# Patient Record
Sex: Female | Born: 1980 | Hispanic: Yes | Smoking: Never smoker
Health system: Southern US, Community
[De-identification: ages and names within clinical notes are randomized; demographics above are authoritative.]

## PROBLEM LIST (undated history)

## (undated) HISTORY — PX: TUBAL LIGATION: SHX77

---

## 2019-10-12 ENCOUNTER — Emergency Department
Admission: EM | Admit: 2019-10-12 | Discharge: 2019-10-13 | Disposition: A | Discharge status: Home / Self Care | Payer: Medicaid - Out of State | Attending: Emergency Medicine | Admitting: Emergency Medicine

## 2019-10-12 ENCOUNTER — Emergency Department: Payer: Medicaid - Out of State

## 2019-10-12 ENCOUNTER — Other Ambulatory Visit: Payer: Self-pay

## 2019-10-12 ENCOUNTER — Encounter: Payer: Self-pay | Admitting: Emergency Medicine

## 2019-10-12 DIAGNOSIS — R300 Dysuria: Secondary | ICD-10-CM | POA: Diagnosis not present

## 2019-10-12 DIAGNOSIS — R1031 Right lower quadrant pain: Secondary | ICD-10-CM | POA: Diagnosis present

## 2019-10-12 DIAGNOSIS — N390 Urinary tract infection, site not specified: Secondary | ICD-10-CM | POA: Insufficient documentation

## 2019-10-12 DIAGNOSIS — R109 Unspecified abdominal pain: Secondary | ICD-10-CM

## 2019-10-12 LAB — COMPREHENSIVE METABOLIC PANEL
ALT: 45 U/L — ABNORMAL HIGH (ref 0–44)
AST: 26 U/L (ref 15–41)
Albumin: 4 g/dL (ref 3.5–5.0)
Alkaline Phosphatase: 67 U/L (ref 38–126)
Anion gap: 8 (ref 5–15)
BUN: 8 mg/dL (ref 6–20)
CO2: 24 mmol/L (ref 22–32)
Calcium: 8.8 mg/dL — ABNORMAL LOW (ref 8.9–10.3)
Chloride: 104 mmol/L (ref 98–111)
Creatinine, Ser: 0.62 mg/dL (ref 0.44–1.00)
GFR calc Af Amer: >60 mL/min (ref >60)
GFR calc non Af Amer: >60 mL/min (ref >60)
Glucose, Bld: 132 mg/dL — ABNORMAL HIGH (ref 70–99)
Potassium: 3.7 mmol/L (ref 3.5–5.1)
Sodium: 136 mmol/L (ref 135–145)
Total Bilirubin: 0.7 mg/dL (ref 0.3–1.2)
Total Protein: 7.5 g/dL (ref 6.5–8.1)

## 2019-10-12 LAB — URINALYSIS, COMPLETE (UACMP) WITH MICROSCOPIC
Bacteria, UA: NONE SEEN
Bilirubin Urine: NEGATIVE
Glucose, UA: NEGATIVE mg/dL
Ketones, ur: NEGATIVE mg/dL
Nitrite: NEGATIVE
Protein, ur: 100 mg/dL — AB
RBC / HPF: >50 RBC/hpf — ABNORMAL HIGH (ref 0–5)
Specific Gravity, Urine: 1.006 (ref 1.005–1.030)
WBC, UA: >50 WBC/hpf — ABNORMAL HIGH (ref 0–5)
pH: 6 (ref 5.0–8.0)

## 2019-10-12 LAB — CBC WITH DIFFERENTIAL/PLATELET
Abs Immature Granulocytes: 0.02 10*3/uL (ref 0.00–0.07)
Basophils Absolute: 0 10*3/uL (ref 0.0–0.1)
Basophils Relative: 0 %
Eosinophils Absolute: 0 10*3/uL (ref 0.0–0.5)
Eosinophils Relative: 0 %
HCT: 36.5 % (ref 36.0–46.0)
Hemoglobin: 11.9 g/dL — ABNORMAL LOW (ref 12.0–15.0)
Immature Granulocytes: 0 %
Lymphocytes Relative: 24 %
Lymphs Abs: 1.8 10*3/uL (ref 0.7–4.0)
MCH: 26.4 pg (ref 26.0–34.0)
MCHC: 32.6 g/dL (ref 30.0–36.0)
MCV: 81.1 fL (ref 80.0–100.0)
Monocytes Absolute: 0.5 10*3/uL (ref 0.1–1.0)
Monocytes Relative: 6 %
Neutro Abs: 5.1 10*3/uL (ref 1.7–7.7)
Neutrophils Relative %: 70 %
Platelets: 219 10*3/uL (ref 150–400)
RBC: 4.5 MIL/uL (ref 3.87–5.11)
RDW: 14 % (ref 11.5–15.5)
WBC: 7.5 10*3/uL (ref 4.0–10.5)
nRBC: 0 % (ref 0.0–0.2)

## 2019-10-12 LAB — LIPASE, BLOOD: Lipase: 19 U/L (ref 11–51)

## 2019-10-12 LAB — POCT PREGNANCY, URINE: Preg Test, Ur: NEGATIVE

## 2019-10-12 MED ORDER — PHENAZOPYRIDINE HCL 200 MG PO TABS
200.0000 mg | ORAL_TABLET | Freq: Three times a day (TID) | ORAL | 0 refills | Status: AC | PRN
Start: 2019-10-12 — End: ?

## 2019-10-12 MED ORDER — CEPHALEXIN 500 MG PO CAPS
500.0000 mg | ORAL_CAPSULE | Freq: Once | ORAL | Status: AC
  Administered 2019-10-12: 500 mg via ORAL
  Filled 2019-10-12: qty 1

## 2019-10-12 MED ORDER — CEPHALEXIN 500 MG PO CAPS: 500.0000 mg | ORAL_CAPSULE | Freq: Three times a day (TID) | ORAL | 0 refills | Status: AC

## 2019-10-12 MED ORDER — KETOROLAC TROMETHAMINE 60 MG/2ML IM SOLN
30.0000 mg | Freq: Once | INTRAMUSCULAR | Status: AC
  Administered 2019-10-12: 30 mg via INTRAMUSCULAR
  Filled 2019-10-12: qty 2

## 2019-10-12 MED ORDER — PHENAZOPYRIDINE HCL 200 MG PO TABS
200.0000 mg | ORAL_TABLET | Freq: Once | ORAL | Status: AC
  Administered 2019-10-12: 200 mg via ORAL
  Filled 2019-10-12: qty 1

## 2019-10-12 MED ORDER — IBUPROFEN 800 MG PO TABS
800.0000 mg | ORAL_TABLET | Freq: Three times a day (TID) | ORAL | 0 refills | Status: AC | PRN
Start: 2019-10-12 — End: ?

## 2019-10-12 MED ORDER — HYDROCODONE-ACETAMINOPHEN 5-325 MG PO TABS
1.0000 | ORAL_TABLET | Freq: Once | ORAL | Status: AC
  Administered 2019-10-12: 1 via ORAL
  Filled 2019-10-12: qty 1

## 2019-10-12 MED ORDER — HYDROCODONE-ACETAMINOPHEN 5-325 MG PO TABS: 1.0000 | ORAL_TABLET | Freq: Four times a day (QID) | ORAL | 0 refills | Status: AC | PRN

## 2019-10-12 NOTE — ED Notes (Signed)
Pt to CT and has returned at this time.

## 2019-10-12 NOTE — ED Provider Notes (Signed)
[facility] Emergency Department Provider Note   ____________________________________________   First MD Initiated Contact with Patient 10/12/19 2308     (approximate)  I have reviewed the triage vital signs and the nursing notes.   HISTORY  Chief Complaint Abdominal Pain  History obtained via tele-Spanish interpreter  HPI Kristine Patterson is a 39 y.o. female who presents to the ED from home with a chief complaint of right flank/lower quadrant pain.  Onset yesterday morning.  Associated dysuria.  Denies fever, chills, cough, chest pain, shortness of breath, nausea, vomiting, diarrhea.  Denies recent travel or trauma.       Past medical history None  There are no problems to display for this patient.   Past Surgical History:  Procedure Laterality Date  . TUBAL LIGATION      Prior to Admission medications   Medication Sig Start Date End Date Taking? Authorizing Provider  cephALEXin (KEFLEX) 500 MG capsule Take 1 capsule (500 mg total) by mouth 3 (three) times daily. 10/12/19   Irean Hong, MD  HYDROcodone-acetaminophen (NORCO) 5-325 MG tablet Take 1 tablet by mouth every 6 (six) hours as needed for moderate pain. 10/12/19   Irean Hong, MD  ibuprofen (ADVIL) 800 MG tablet Take 1 tablet (800 mg total) by mouth every 8 (eight) hours as needed for moderate pain. 10/12/19   Irean Hong, MD  phenazopyridine (PYRIDIUM) 200 MG tablet Take 1 tablet (200 mg total) by mouth 3 (three) times daily as needed for pain. 10/12/19   Irean Hong, MD    Allergies Patient has no known allergies.  No family history on file.  Social History Social History   Tobacco Use  . Smoking status: Not on file  Substance Use Topics  . Alcohol use: Not on file  . Drug use: Not on file    Review of Systems  Constitutional: No fever/chills Eyes: No visual changes. ENT: No sore throat. Cardiovascular: Denies chest pain. Respiratory: Denies shortness of  breath. Gastrointestinal: Positive for right flank/lower quadrant abdominal pain.  No nausea, no vomiting.  No diarrhea.  No constipation. Genitourinary: Positive for dysuria. Musculoskeletal: Negative for back pain. Skin: Negative for rash. Neurological: Negative for headaches, focal weakness or numbness.   ____________________________________________   PHYSICAL EXAM:  VITAL SIGNS: ED Triage Vitals  Enc Vitals Group     BP 10/12/19 1927 129/75     Pulse Rate 10/12/19 1927 95     Resp 10/12/19 1927 18     Temp 10/12/19 1927 98.3 F (36.8 C)     Temp Source 10/12/19 1927 Oral     SpO2 10/12/19 1927 98 %     Weight 10/12/19 1926 (!) 310 lb 10.1 oz (140.9 kg)     Height --      Head Circumference --      Peak Flow --      Pain Score 10/12/19 1925 8     Pain Loc --      Pain Edu? --      Excl. in GC? --     Constitutional: Alert and oriented. Well appearing and in no acute distress. Eyes: Conjunctivae are normal. PERRL. EOMI. Head: Atraumatic. Nose: No congestion/rhinnorhea. Mouth/Throat: Mucous membranes are moist.  Oropharynx non-erythematous. Neck: No stridor.   Cardiovascular: Normal rate, regular rhythm. Grossly normal heart sounds.  Good peripheral circulation. Respiratory: Normal respiratory effort.  No retractions. Lungs CTAB. Gastrointestinal: Soft and nontender to light or deep palpation. No distention.  No abdominal bruits. No CVA tenderness. Musculoskeletal: No lower extremity tenderness nor edema.  No joint effusions. Neurologic:  Normal speech and language. No gross focal neurologic deficits are appreciated. No gait instability. Skin:  Skin is warm, dry and intact. No rash noted.  No vesicles. Psychiatric: Mood and affect are normal. Speech and behavior are normal.  ____________________________________________   LABS (all labs ordered are listed, but only abnormal results are displayed)  Labs Reviewed  CBC WITH DIFFERENTIAL/PLATELET - Abnormal; Notable  for the following components:      Result Value   Hemoglobin 11.9 (*)    All other components within normal limits  COMPREHENSIVE METABOLIC PANEL - Abnormal; Notable for the following components:   Glucose, Bld 132 (*)    Calcium 8.8 (*)    ALT 45 (*)    All other components within normal limits  URINALYSIS, COMPLETE (UACMP) WITH MICROSCOPIC - Abnormal; Notable for the following components:   Color, Urine YELLOW (*)    APPearance CLOUDY (*)    Hgb urine dipstick LARGE (*)    Protein, ur 100 (*)    Leukocytes,Ua LARGE (*)    RBC / HPF >50 (*)    WBC, UA >50 (*)    All other components within normal limits  LIPASE, BLOOD  POCT PREGNANCY, URINE   ____________________________________________  EKG  None ____________________________________________  RADIOLOGY  ED MD interpretation: Unremarkable CT  Official radiology report(s): CT Renal Stone Study  Result Date: 10/12/2019 CLINICAL DATA:  Lower abdominal pain radiating to right flank EXAM: CT ABDOMEN AND PELVIS WITHOUT CONTRAST TECHNIQUE: Multidetector CT imaging of the abdomen and pelvis was performed following the standard protocol without IV contrast. COMPARISON:  None. FINDINGS: Lower chest: No acute pleural or parenchymal lung disease. Hepatobiliary: No focal liver abnormality is seen. No gallstones, gallbladder wall thickening, or biliary dilatation. Pancreas: Unremarkable. No pancreatic ductal dilatation or surrounding inflammatory changes. Spleen: Normal in size without focal abnormality. Adrenals/Urinary Tract: No urinary tract calculi or obstructive uropathy. Bladder is unremarkable. The adrenals are normal. Stomach/Bowel: No bowel obstruction or ileus. Normal appendix right lower quadrant. No bowel wall thickening or inflammatory change. Vascular/Lymphatic: No significant vascular findings are present. No enlarged abdominal or pelvic lymph nodes. Reproductive: Uterus and bilateral adnexa are unremarkable. Other: No abdominal  wall hernia or abnormality. No abdominopelvic ascites. Musculoskeletal: No acute or destructive bony lesions. Reconstructed images demonstrate no additional findings. IMPRESSION: 1. No urinary tract calculi or obstructive uropathy. 2. Normal appendix. Electronically Signed   By: Sharlet Salina M.D.   On: 10/12/2019 23:14    ____________________________________________   PROCEDURES  Procedure(s) performed (including Critical Care):  Procedures   ____________________________________________   INITIAL IMPRESSION / ASSESSMENT AND PLAN / ED COURSE  As part of my medical decision making, I reviewed the following data within the electronic MEDICAL RECORD NUMBER Nursing notes reviewed and incorporated, Labs reviewed, Radiograph reviewed, Notes from prior ED visits and [place] Controlled Substance Database     [facility] Temesha Queener was evaluated in Emergency Department on 10/12/2019 for the symptoms described in the history of present illness. She was evaluated in the context of the global COVID-19 pandemic, which necessitated consideration that the patient might be at risk for infection with the SARS-CoV-2 virus that causes COVID-19. Institutional protocols and algorithms that pertain to the evaluation of patients at risk for COVID-19 are in a state of rapid change based on information released by regulatory bodies including the CDC and federal and state organizations. These policies and  algorithms were followed during the patient's care in the ED.    39 year old female presenting with right flank/lower quadrant abdominal pain and dysuria. Differential diagnosis includes, but is not limited to, ovarian cyst, ovarian torsion, acute appendicitis, diverticulitis, urinary tract infection/pyelonephritis, endometriosis, bowel obstruction, colitis, renal colic, gastroenteritis, hernia, fibroids, endometriosis, pregnancy related pain including ectopic pregnancy, etc.  Laboratory results unremarkable; UA positive  for leukocytes and WBC, CT renal colic study unremarkable.  Will administer IM Toradol and Norco for pain, start Keflex for antibiotic, Pyridium for urinary discomfort.  Strict return precautions given.  Patient verbalizes understanding agrees with plan of care.      ____________________________________________   FINAL CLINICAL IMPRESSION(S) / ED DIAGNOSES  Final diagnoses:  Lower urinary tract infectious disease  Flank pain  Dysuria  Right lower quadrant abdominal pain     ED Discharge Orders         Ordered    cephALEXin (KEFLEX) 500 MG capsule  3 times daily     10/12/19 2329    ibuprofen (ADVIL) 800 MG tablet  Every 8 hours PRN     10/12/19 2329    HYDROcodone-acetaminophen (NORCO) 5-325 MG tablet  Every 6 hours PRN     10/12/19 2329    phenazopyridine (PYRIDIUM) 200 MG tablet  3 times daily PRN     10/12/19 2329           Note:  This document was prepared using Dragon voice recognition software and may include unintentional dictation errors.   Paulette Blanch, MD 10/13/19 [phone]

## 2019-10-12 NOTE — ED Triage Notes (Signed)
Patient ambulatory to triage with steady gait, without difficulty or distress noted, mask in place; pt reports having lower abd pain radiating around into rt flank since yesterday with no accomp symptoms; denies hx of same

## 2019-10-12 NOTE — Discharge Instructions (Addendum)
1.  Take antibiotic as prescribed (Keflex 500 mg 3 times daily x7 days). 2.  Take Pyridium for urinary discomfort. 3.  You may take pain medicines as needed (Motrin/Norco #15). 4.  Return to the ER for worsening symptoms, persistent vomiting, fever or other concerns.

## 2019-10-12 NOTE — ED Notes (Signed)
Provider at bedside

## 2019-10-13 NOTE — ED Notes (Signed)
No reaction noted to IM injection.

## 2019-11-26 ENCOUNTER — Other Ambulatory Visit: Payer: Self-pay

## 2019-11-26 ENCOUNTER — Emergency Department: Payer: Medicaid - Out of State

## 2019-11-26 DIAGNOSIS — R079 Chest pain, unspecified: Secondary | ICD-10-CM | POA: Diagnosis present

## 2019-11-26 LAB — BASIC METABOLIC PANEL
Anion gap: 7 (ref 5–15)
BUN: 15 mg/dL (ref 6–20)
CO2: 23 mmol/L (ref 22–32)
Calcium: 8.7 mg/dL — ABNORMAL LOW (ref 8.9–10.3)
Chloride: 106 mmol/L (ref 98–111)
Creatinine, Ser: 0.59 mg/dL (ref 0.44–1.00)
GFR calc Af Amer: >60 mL/min (ref >60)
GFR calc non Af Amer: >60 mL/min (ref >60)
Glucose, Bld: 130 mg/dL — ABNORMAL HIGH (ref 70–99)
Potassium: 3.9 mmol/L (ref 3.5–5.1)
Sodium: 136 mmol/L (ref 135–145)

## 2019-11-26 LAB — CBC
HCT: 37.1 % (ref 36.0–46.0)
Hemoglobin: 11.9 g/dL — ABNORMAL LOW (ref 12.0–15.0)
MCH: 26.2 pg (ref 26.0–34.0)
MCHC: 32.1 g/dL (ref 30.0–36.0)
MCV: 81.5 fL (ref 80.0–100.0)
Platelets: 233 10*3/uL (ref 150–400)
RBC: 4.55 MIL/uL (ref 3.87–5.11)
RDW: 14.2 % (ref 11.5–15.5)
WBC: 5.4 10*3/uL (ref 4.0–10.5)
nRBC: 0 % (ref 0.0–0.2)

## 2019-11-26 LAB — TROPONIN I (HIGH SENSITIVITY): Troponin I (High Sensitivity): 3 ng/L (ref <18)

## 2019-11-26 MED ORDER — SODIUM CHLORIDE 0.9% FLUSH: 3.0000 mL | Freq: Once | INTRAVENOUS | Status: DC

## 2019-11-26 NOTE — ED Notes (Signed)
Patient to waiting room via wheelchair by EMS.  Per EMS patient complains of chest pain and dizzy.  EMS had difficulty communicating due to language barrier.  EMS interventions - tem 98.1, hr 74, unable to obtain bp, pulse oxi 99% on room air, cbg 121, 12-lead unremarkable.

## 2019-11-26 NOTE — ED Triage Notes (Signed)
Reports left upper chest pain that radiates into left shoulder/arm all day.  Pain comes and goes.

## 2019-11-27 ENCOUNTER — Emergency Department
Admission: EM | Admit: 2019-11-27 | Discharge: 2019-11-27 | Disposition: A | Discharge status: Home / Self Care | Payer: Medicaid - Out of State | Attending: Emergency Medicine | Admitting: Emergency Medicine

## 2019-11-27 DIAGNOSIS — R079 Chest pain, unspecified: Secondary | ICD-10-CM

## 2019-11-27 LAB — LIPASE, BLOOD: Lipase: 24 U/L (ref 11–51)

## 2019-11-27 LAB — HEPATIC FUNCTION PANEL
ALT: 51 U/L — ABNORMAL HIGH (ref 0–44)
AST: 43 U/L — ABNORMAL HIGH (ref 15–41)
Albumin: 3.9 g/dL (ref 3.5–5.0)
Alkaline Phosphatase: 73 U/L (ref 38–126)
Bilirubin, Direct: <0.1 mg/dL (ref 0.0–0.2)
Total Bilirubin: 0.5 mg/dL (ref 0.3–1.2)
Total Protein: 7.7 g/dL (ref 6.5–8.1)

## 2019-11-27 LAB — TROPONIN I (HIGH SENSITIVITY): Troponin I (High Sensitivity): 5 ng/L (ref <18)

## 2019-11-27 MED ORDER — LIDOCAINE 5 % EX PTCH: 1.0000 | MEDICATED_PATCH | Freq: Two times a day (BID) | CUTANEOUS | 0 refills | Status: AC

## 2019-11-27 MED ORDER — LIDOCAINE 5 % EX PTCH
1.0000 | MEDICATED_PATCH | CUTANEOUS | Status: DC
  Administered 2019-11-27: 1 via TRANSDERMAL
  Filled 2019-11-27: qty 1

## 2019-11-27 MED ORDER — ACETAMINOPHEN 500 MG PO TABS
1000.0000 mg | ORAL_TABLET | Freq: Once | ORAL | Status: AC
  Administered 2019-11-27: 1000 mg via ORAL
  Filled 2019-11-27: qty 2

## 2019-11-27 NOTE — ED Notes (Signed)
AMN translation used. Byrd Hesselbach: [id number]. Pt states coming in due to chest pain yesterday with hand numbness. Pt states she does not have a lot of pain anymore. Pt states chest tightness. Pt states pain started after being in an argument with her daughter. Pt states she had some SOB when it first started. Pt denies fever, cough, nausea and vomiting. Pt states not having any pain like this before and denies medical conditions.    Pt on cardiac, bp and pulse ox monitor.

## 2019-11-27 NOTE — ED Notes (Signed)
RN obtained second troponin via straight stick. 21g to the left hand. Bleeding controlled post stick

## 2019-11-27 NOTE — ED Provider Notes (Signed)
[facility] Emergency Department Provider Note   ____________________________________________   First MD Initiated Contact with Patient 11/27/19 [phone]     (approximate)  I have reviewed the triage vital signs and the nursing notes.   HISTORY  Chief Complaint Chest Pain    HPI Kristine Patterson is a 39 y.o. female with no significant past medical history who presents to the ED complaining of chest pain.  History is limited as patient is Spanish-speaking only and history obtained via video interpreter [phone].  Patient states that she has been dealing with aching pain in the left side of her chest since yesterday morning.  She describes it as constant and not exacerbated or alleviated by anything in particular.  Pain seems to have eased off and she denies any fevers, cough, or shortness of breath.  She has not had any trauma to her chest and denies any recent heavy lifting.  She has not had any nausea or vomiting.  Pain does seem to radiate into her left arm at times and is worse with movement of her left arm.        No past medical history on file.  There are no problems to display for this patient.   Past Surgical History:  Procedure Laterality Date  . TUBAL LIGATION      Prior to Admission medications   Medication Sig Start Date End Date Taking? Authorizing Provider  cephALEXin (KEFLEX) 500 MG capsule Take 1 capsule (500 mg total) by mouth 3 (three) times daily. 10/12/19   Irean Hong, MD  HYDROcodone-acetaminophen (NORCO) 5-325 MG tablet Take 1 tablet by mouth every 6 (six) hours as needed for moderate pain. 10/12/19   Irean Hong, MD  ibuprofen (ADVIL) 800 MG tablet Take 1 tablet (800 mg total) by mouth every 8 (eight) hours as needed for moderate pain. 10/12/19   Irean Hong, MD  phenazopyridine (PYRIDIUM) 200 MG tablet Take 1 tablet (200 mg total) by mouth 3 (three) times daily as needed for pain. 10/12/19   Irean Hong, MD     Allergies Patient has no known allergies.  No family history on file.  Social History Social History   Tobacco Use  . Smoking status: Not on file  Substance Use Topics  . Alcohol use: Not on file  . Drug use: Not on file    Review of Systems  Constitutional: No fever/chills Eyes: No visual changes. ENT: No sore throat. Cardiovascular: Positive for chest pain. Respiratory: Denies shortness of breath. Gastrointestinal: No abdominal pain.  No nausea, no vomiting.  No diarrhea.  No constipation. Genitourinary: Negative for dysuria. Musculoskeletal: Negative for back pain. Skin: Negative for rash. Neurological: Negative for headaches, focal weakness or numbness.  ____________________________________________   PHYSICAL EXAM:  VITAL SIGNS: ED Triage Vitals  Enc Vitals Group     BP 11/26/19 2318 (!) 146/87     Pulse Rate 11/26/19 2318 69     Resp 11/26/19 2318 16     Temp 11/26/19 2318 98.3 F (36.8 C)     Temp Source 11/26/19 2318 Oral     SpO2 11/26/19 2318 100 %     Weight 11/26/19 2311 220 lb (99.8 kg)     Height 11/26/19 2311 5\' 3"  (1.6 m)     Head Circumference --      Peak Flow --      Pain Score 11/26/19 2311 4     Pain Loc --  Pain Edu? --      Excl. in GC? --     Constitutional: Alert and oriented. Eyes: Conjunctivae are normal. Head: Atraumatic. Nose: No congestion/rhinnorhea. Mouth/Throat: Mucous membranes are moist. Neck: Normal ROM Cardiovascular: Normal rate, regular rhythm. Grossly normal heart sounds.  2+ radial pulses bilaterally. Respiratory: Normal respiratory effort.  No retractions. Lungs CTAB.  Left chest wall tender to palpation, reproducing described pain. Gastrointestinal: Soft and nontender. No distention. Genitourinary: deferred Musculoskeletal: No lower extremity tenderness nor edema. Neurologic:  Normal speech and language. No gross focal neurologic deficits are appreciated. Skin:  Skin is warm, dry and intact. No rash  noted. Psychiatric: Mood and affect are normal. Speech and behavior are normal.  ____________________________________________   LABS (all labs ordered are listed, but only abnormal results are displayed)  Labs Reviewed  BASIC METABOLIC PANEL - Abnormal; Notable for the following components:      Result Value   Glucose, Bld 130 (*)    Calcium 8.7 (*)    All other components within normal limits  CBC - Abnormal; Notable for the following components:   Hemoglobin 11.9 (*)    All other components within normal limits  HEPATIC FUNCTION PANEL - Abnormal; Notable for the following components:   AST 43 (*)    ALT 51 (*)    All other components within normal limits  LIPASE, BLOOD  POC URINE PREG, ED  TROPONIN I (HIGH SENSITIVITY)  TROPONIN I (HIGH SENSITIVITY)   ____________________________________________  EKG  ED ECG REPORT I, Chesley Noon, the attending physician, personally viewed and interpreted this ECG.   Date: 11/27/2019  EKG Time: 23:05  Rate: 74  Rhythm: normal sinus rhythm  Axis: Normal  Intervals:none  ST&T Change: None   PROCEDURES  Procedure(s) performed (including Critical Care):  Procedures   ____________________________________________   INITIAL IMPRESSION / ASSESSMENT AND PLAN / ED COURSE      39 year old female with no significant past medical history presents to the ED complaining of aching left-sided chest pain since yesterday morning that radiates into her left arm.  She is neurovascularly intact to her bilateral upper extremities with no edema or tenderness.  She does have left-sided chest wall tenderness to palpation, reproducing her pain.  No associated rash to suggest shingles.  EKG shows no evidence of arrhythmia or ischemia and initial troponin is negative.  Chest x-ray also noted negative for acute process.  We will repeat second set troponin, but if this is negative I have a very low suspicion for ACS given atypical symptoms with heart  score of less than 4.  Patient is also PERC negative and I doubt PE.  Remainder of her blood work is reassuring, suspect musculoskeletal etiology of pain, we will treat patient's symptoms with Lidoderm patch and Tylenol.  If repeat troponin is negative, she would be appropriate for discharge home with PCP follow-up.  Repeat troponin within normal limits and patient states pain is improved following Lidoderm patch and Tylenol.  At this point, she is appropriate for discharge home and will be provided with referral for PCP.  She was counseled to return to the ED for new worsening symptoms, patient agrees with plan.      ____________________________________________   FINAL CLINICAL IMPRESSION(S) / ED DIAGNOSES  Final diagnoses:  Nonspecific chest pain     ED Discharge Orders    None       Note:  This document was prepared using Dragon voice recognition software and may include unintentional dictation errors.  Chesley Noon, MD 11/27/19 [phone]

## 2020-11-02 ENCOUNTER — Encounter: Payer: Self-pay | Admitting: Emergency Medicine

## 2020-11-02 ENCOUNTER — Emergency Department
Admission: EM | Admit: 2020-11-02 | Discharge: 2020-11-02 | Disposition: A | Discharge status: Home / Self Care | Payer: Medicaid - Out of State | Attending: Emergency Medicine | Admitting: Emergency Medicine

## 2020-11-02 ENCOUNTER — Other Ambulatory Visit: Payer: Self-pay

## 2020-11-02 DIAGNOSIS — H1131 Conjunctival hemorrhage, right eye: Secondary | ICD-10-CM | POA: Diagnosis not present

## 2020-11-02 DIAGNOSIS — H5789 Other specified disorders of eye and adnexa: Secondary | ICD-10-CM | POA: Diagnosis present

## 2020-11-02 MED ORDER — NAPHAZOLINE-PHENIRAMINE 0.025-0.3 % OP SOLN: 1.0000 [drp] | Freq: Four times a day (QID) | OPHTHALMIC | 0 refills | Status: AC | PRN

## 2020-11-02 NOTE — ED Triage Notes (Signed)
Pt via POV from home. Pt c/o R eye redness since Wednesday. Denies pain but states she had some drainage. On arrival, cornea is red no drainage noted. Pt is A&Ox4 and NAD. Spanish interpreter used.

## 2020-11-02 NOTE — Discharge Instructions (Addendum)
Follow discharge care instruction take medication as directed. °

## 2020-11-02 NOTE — ED Provider Notes (Signed)
[facility] Emergency Department Provider Note   ____________________________________________   Event Date/Time   First MD Initiated Contact with Patient 11/02/20 1437     (approximate)  I have reviewed the triage vital signs and the nursing notes.   HISTORY  Chief Complaint Eye Problem    HPI [facility] Kristine Patterson is a 40 y.o. female patient presents with redness in the right for 2 days.  Patient is intermitting drainage.  Patient denies medical allergies.  Patient does wear contact lenses.  Patient states no other provoking incident for complaint.  Patient denies vision loss patient denies eye pain.         No past medical history on file.  There are no problems to display for this patient.   Past Surgical History:  Procedure Laterality Date   TUBAL LIGATION      Prior to Admission medications   Medication Sig Start Date End Date Taking? Authorizing Provider  naphazoline-pheniramine (NAPHCON-A) 0.025-0.3 % ophthalmic solution Place 1 drop into the right eye 4 (four) times daily as needed for eye irritation. 11/02/20  Yes Joni Reining, PA-C  cephALEXin (KEFLEX) 500 MG capsule Take 1 capsule (500 mg total) by mouth 3 (three) times daily. 10/12/19   Irean Hong, MD  HYDROcodone-acetaminophen (NORCO) 5-325 MG tablet Take 1 tablet by mouth every 6 (six) hours as needed for moderate pain. 10/12/19   Irean Hong, MD  ibuprofen (ADVIL) 800 MG tablet Take 1 tablet (800 mg total) by mouth every 8 (eight) hours as needed for moderate pain. 10/12/19   Irean Hong, MD  lidocaine (LIDODERM) 5 % Place 1 patch onto the skin every 12 (twelve) hours. Remove & Discard patch within 12 hours or as directed by MD 11/27/19 11/26/20  Chesley Noon, MD  phenazopyridine (PYRIDIUM) 200 MG tablet Take 1 tablet (200 mg total) by mouth 3 (three) times daily as needed for pain. 10/12/19   Irean Hong, MD    Allergies Patient has no known allergies.  No family history  on file.  Social History Social History   Tobacco Use   Smoking status: Never   Smokeless tobacco: Never  Substance Use Topics   Alcohol use: Yes   Drug use: Never    Review of Systems Constitutional: No fever/chills Eyes: No visual changes.  Redness inferior medial aspect right eye ENT: No sore throat. Cardiovascular: Denies chest pain. Respiratory: Denies shortness of breath. Gastrointestinal: No abdominal pain.  No nausea, no vomiting.  No diarrhea.  No constipation. Genitourinary: Negative for dysuria. Musculoskeletal: Negative for back pain. Skin: Negative for rash. Neurological: Negative for headaches, focal weakness or numbness.   ____________________________________________   PHYSICAL EXAM:  VITAL SIGNS: ED Triage Vitals [11/02/20 1409]  Enc Vitals Group     BP 131/78     Pulse Rate 72     Resp 20     Temp 98.3 F (36.8 C)     Temp Source Oral     SpO2 100 %     Weight 220 lb (99.8 kg)     Height 5\' 6"  (1.676 m)     Head Circumference      Peak Flow      Pain Score 0     Pain Loc      Pain Edu?      Excl. in GC?     Constitutional: Alert and oriented. Well appearing and in no acute distress. Eyes: Subconjunctival bleeding right eye  PERRL.  EOMI. Head: Atraumatic. Nose: No congestion/rhinnorhea. Mouth/Throat: Mucous membranes are moist.  Oropharynx non-erythematous. Neck: No stridor.   Hematological/Lymphatic/Immunilogical: No cervical lymphadenopathy. Cardiovascular: Normal rate, regular rhythm. Grossly normal heart sounds.  Good peripheral circulation. Respiratory: Normal respiratory effort.  No retractions. Lungs CTAB. Neurologic:  Normal speech and language. No gross focal neurologic deficits are appreciated. No gait instability. Skin:  Skin is warm, dry and intact. No rash noted. Psychiatric: Mood and affect are normal. Speech and behavior are normal.  ____________________________________________   LABS (all labs ordered are listed, but  only abnormal results are displayed)  Labs Reviewed - No data to display ____________________________________________  EKG   ____________________________________________  RADIOLOGY I, Joni Reining, personally viewed and evaluated these images (plain radiographs) as part of my medical decision making, as well as reviewing the written report by the radiologist.  ED MD interpretation:    Official radiology report(s): No results found.  ____________________________________________   PROCEDURES  Procedure(s) performed (including Critical Care):  Procedures   ____________________________________________   INITIAL IMPRESSION / ASSESSMENT AND PLAN / ED COURSE  As part of my medical decision making, I reviewed the following data within the electronic MEDICAL RECORD NUMBER         Patient complaining of redness to the right lower eye for 2 days.  No provocative incident for complaint.  Patient denies vision loss or eye pain.  Patient complaint physical exam consistent with subconjunctival hemorrhaging of the right eye.  Patient given discharge care instructions.  Patient advised to follow-up with ophthalmology if condition worsens.  Use eyedrops as directed.      ____________________________________________   FINAL CLINICAL IMPRESSION(S) / ED DIAGNOSES  Final diagnoses:  Subconjunctival hemorrhage of right eye     ED Discharge Orders          Ordered    naphazoline-pheniramine (NAPHCON-A) 0.025-0.3 % ophthalmic solution  4 times daily PRN        11/02/20 1443             Note:  This document was prepared using Dragon voice recognition software and may include unintentional dictation errors.    Joni Reining, PA-C 11/02/20 1454    Delton [place], MD 11/02/20 1520

## 2020-12-18 IMAGING — CT CT RENAL STONE PROTOCOL
2 of 4 series · 17 of 46 positions shown, 19 images · non-contrast
Comparison: None.

CLINICAL DATA: Lower abdominal pain radiating to right flank

EXAM:
CT ABDOMEN AND PELVIS WITHOUT CONTRAST
TECHNIQUE: Multidetector CT imaging of the abdomen and pelvis was performed
following the standard protocol without IV contrast.

[Series 2: stone full standard · axial · 0.98mm/px · z∈[-852,-412]mm · 14 of 97 slices shown, 16 images]
[im 5/97  soft-tissue]
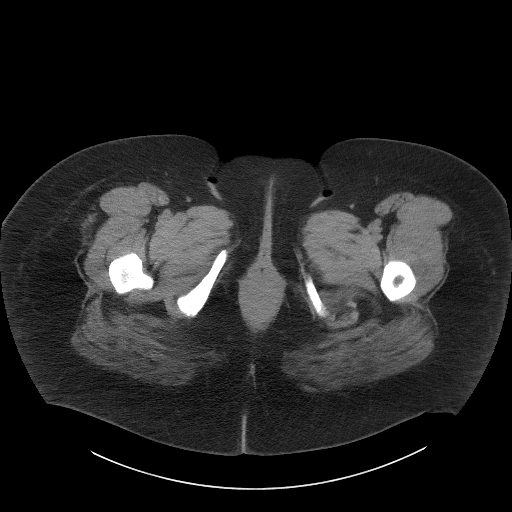
[im 5/97  bone]
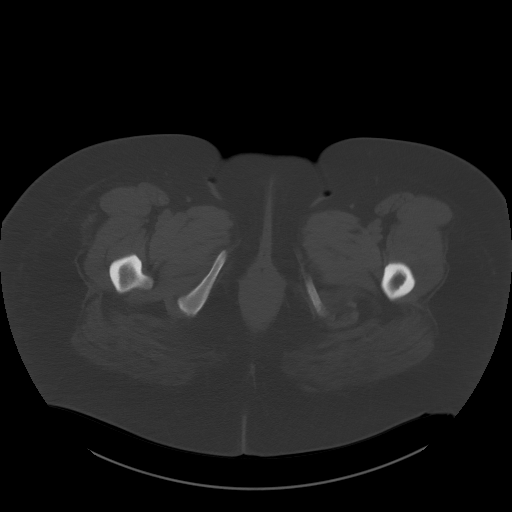
[im 13/97  soft-tissue]
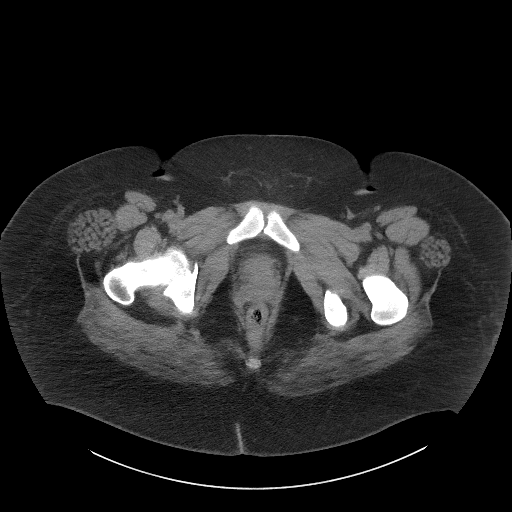
[im 21/97  soft-tissue]
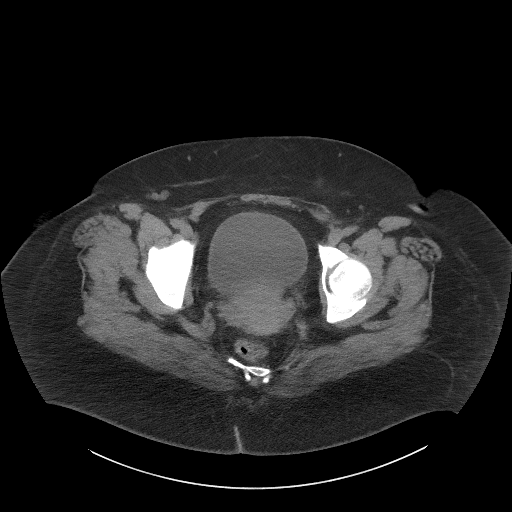
[im 25/97  soft-tissue]
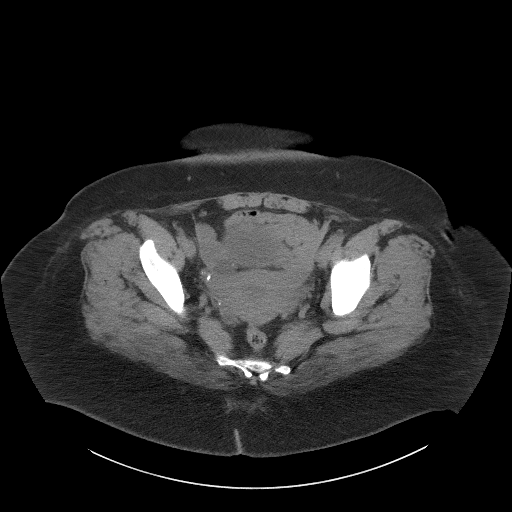
[im 33/97  soft-tissue]
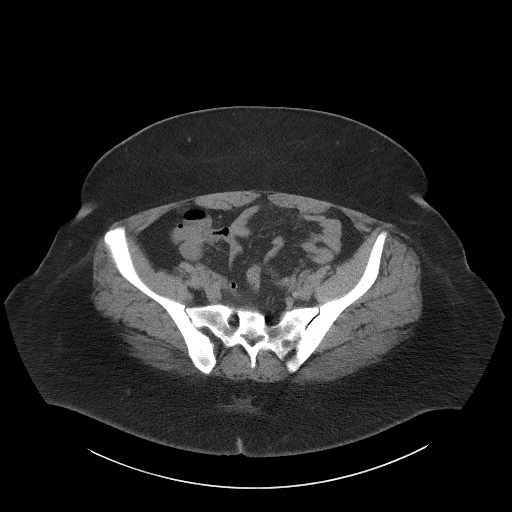
[im 41/97  soft-tissue]
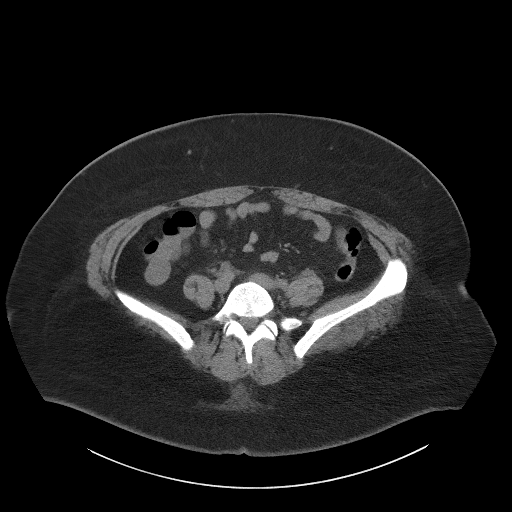
[im 45/97  soft-tissue]
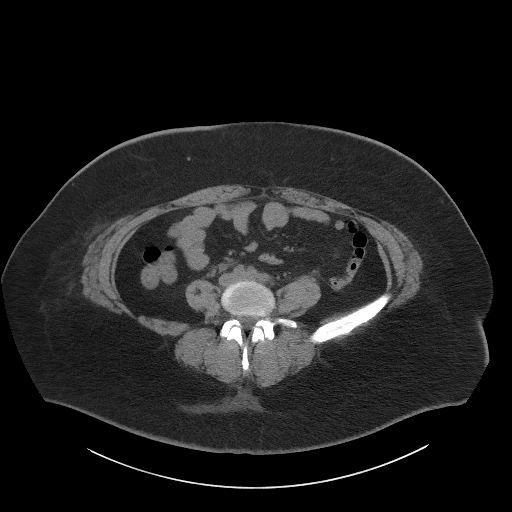
[im 53/97  soft-tissue]
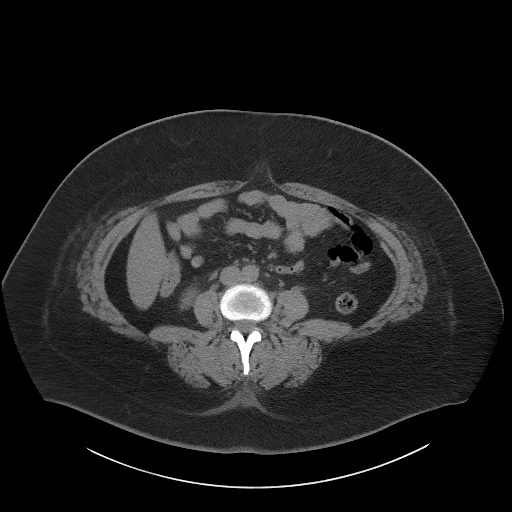
[im 57/97  soft-tissue]
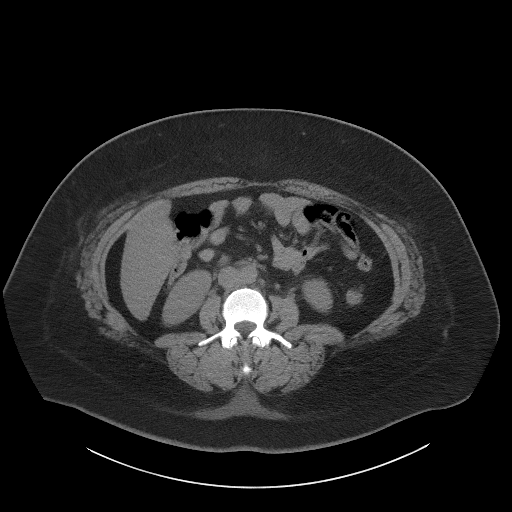
[im 57/97  bone]
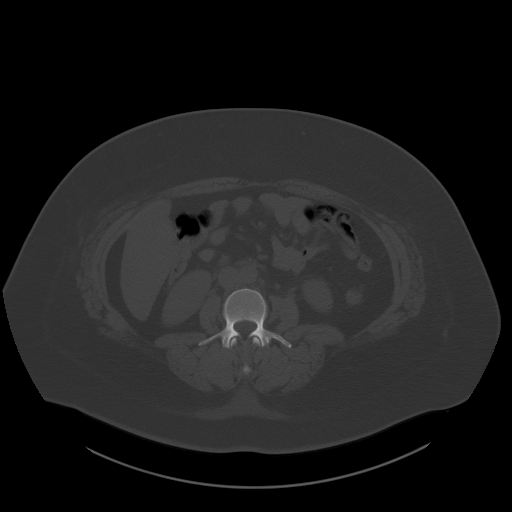
[im 65/97  soft-tissue]
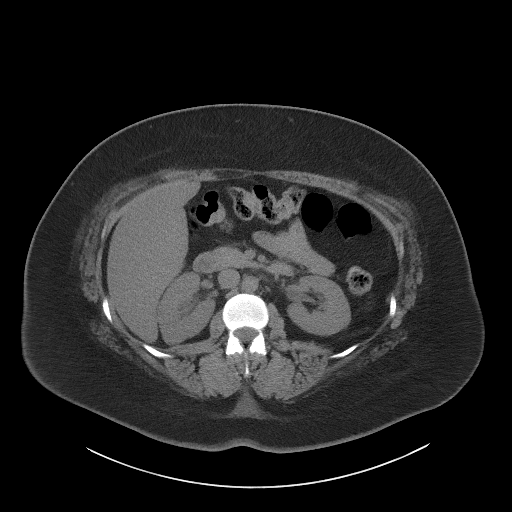
[im 73/97  soft-tissue]
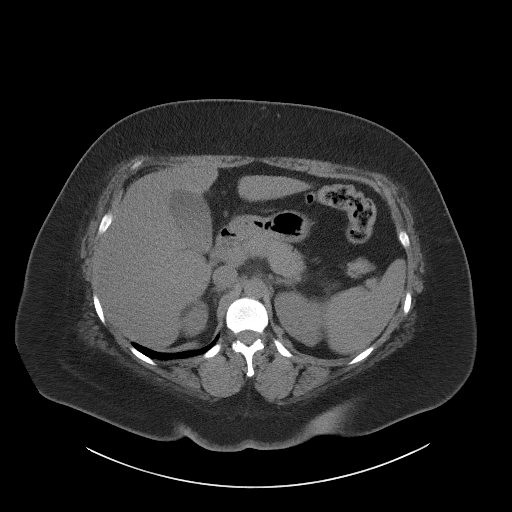
[im 77/97  soft-tissue]
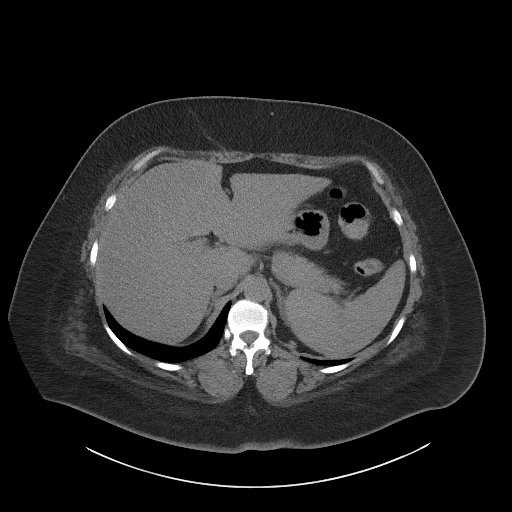
[im 85/97  soft-tissue]
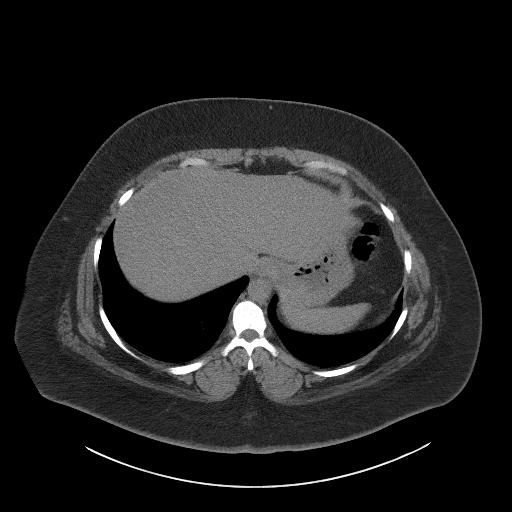
[im 93/97  soft-tissue]
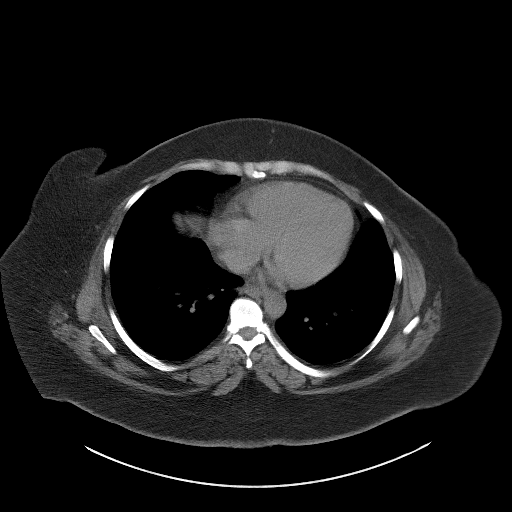

[Series 5: coronal · coronal · 0.96mm/px · 3 of 156 slices shown]
[im 52/156  soft-tissue]
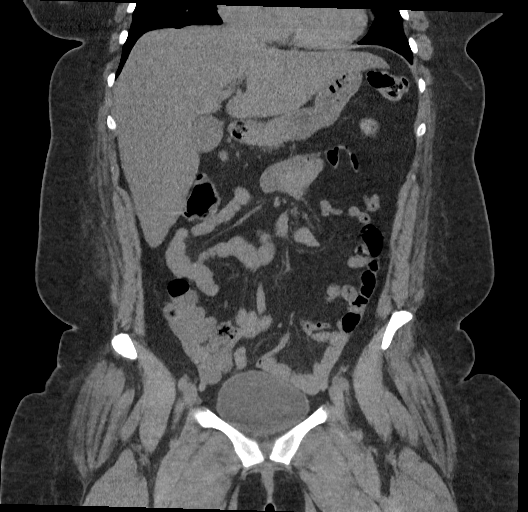
[im 69/156  soft-tissue]
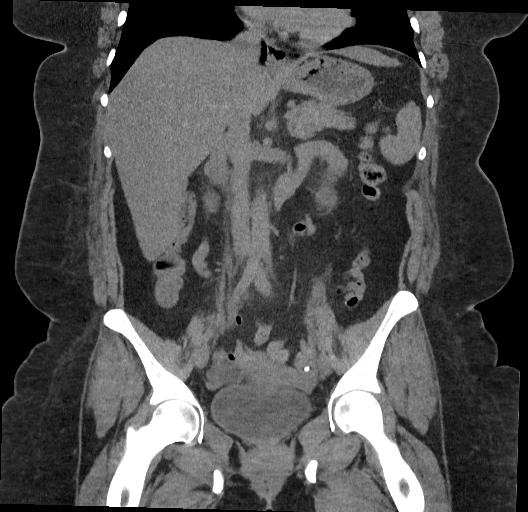
[im 87/156  soft-tissue]
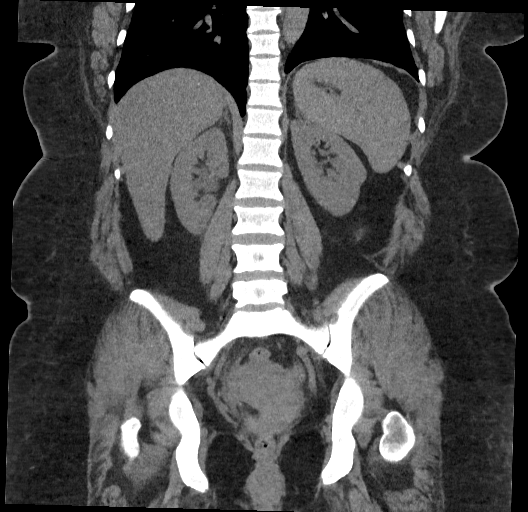

[17 of 46 positions shown; findings below may reference images not displayed]

FINDINGS: Lower chest: No acute pleural or parenchymal lung disease.

Hepatobiliary: No focal liver abnormality is seen. No gallstones,
gallbladder wall thickening, or biliary dilatation.

Pancreas: Unremarkable. No pancreatic ductal dilatation or
surrounding inflammatory changes.

Spleen: Normal in size without focal abnormality.

Adrenals/Urinary Tract: No urinary tract calculi or obstructive
uropathy. Bladder is unremarkable. The adrenals are normal.

Stomach/Bowel: No bowel obstruction or ileus. Normal appendix right
lower quadrant. No bowel wall thickening or inflammatory change.

Vascular/Lymphatic: No significant vascular findings are present. No
enlarged abdominal or pelvic lymph nodes.

Reproductive: Uterus and bilateral adnexa are unremarkable.

Other: No abdominal wall hernia or abnormality. No abdominopelvic
ascites.

Musculoskeletal: No acute or destructive bony lesions. Reconstructed
images demonstrate no additional findings.
IMPRESSION: 1. No urinary tract calculi or obstructive uropathy.
2. Normal appendix.
